# Patient Record
Sex: Female | Born: 1995 | Race: Black or African American | Hispanic: No | Marital: Single | Smoking: Never smoker
Health system: Southern US, Community
[De-identification: ages and names within clinical notes are randomized; demographics above are authoritative.]

## PROBLEM LIST (undated history)

## (undated) DIAGNOSIS — J45909 Unspecified asthma, uncomplicated: Secondary | ICD-10-CM

## (undated) HISTORY — PX: TONSILLECTOMY: SUR1361

## (undated) HISTORY — PX: WISDOM TOOTH EXTRACTION: SHX21

---

## 2017-07-04 ENCOUNTER — Encounter (HOSPITAL_COMMUNITY): Payer: Self-pay | Admitting: Emergency Medicine

## 2017-07-04 ENCOUNTER — Emergency Department (HOSPITAL_COMMUNITY)
Admission: EM | Admit: 2017-07-04 | Discharge: 2017-07-04 | Disposition: A | Discharge status: Home / Self Care | Payer: Medicaid - Out of State | Attending: Emergency Medicine | Admitting: Emergency Medicine

## 2017-07-04 ENCOUNTER — Emergency Department (HOSPITAL_COMMUNITY): Payer: Medicaid - Out of State

## 2017-07-04 DIAGNOSIS — J45909 Unspecified asthma, uncomplicated: Secondary | ICD-10-CM | POA: Diagnosis not present

## 2017-07-04 DIAGNOSIS — R05 Cough: Secondary | ICD-10-CM | POA: Diagnosis present

## 2017-07-04 DIAGNOSIS — Z3202 Encounter for pregnancy test, result negative: Secondary | ICD-10-CM | POA: Diagnosis not present

## 2017-07-04 DIAGNOSIS — B349 Viral infection, unspecified: Secondary | ICD-10-CM | POA: Diagnosis not present

## 2017-07-04 HISTORY — DX: Unspecified asthma, uncomplicated: J45.909

## 2017-07-04 LAB — RESPIRATORY PANEL BY PCR
ADENOVIRUS-RVPPCR: NOT DETECTED
BORDETELLA PERTUSSIS-RVPCR: NOT DETECTED
CHLAMYDOPHILA PNEUMONIAE-RVPPCR: NOT DETECTED
CORONAVIRUS NL63-RVPPCR: NOT DETECTED
Coronavirus 229E: NOT DETECTED
Coronavirus HKU1: NOT DETECTED
Coronavirus OC43: DETECTED — AB
INFLUENZA A H1-RVPPCR: NOT DETECTED
INFLUENZA A-RVPPCR: NOT DETECTED
INFLUENZA B-RVPPCR: NOT DETECTED
Influenza A H1 2009: NOT DETECTED
Influenza A H3: NOT DETECTED
Metapneumovirus: NOT DETECTED
Mycoplasma pneumoniae: NOT DETECTED
PARAINFLUENZA VIRUS 3-RVPPCR: NOT DETECTED
PARAINFLUENZA VIRUS 4-RVPPCR: NOT DETECTED
Parainfluenza Virus 1: NOT DETECTED
Parainfluenza Virus 2: NOT DETECTED
RESPIRATORY SYNCYTIAL VIRUS-RVPPCR: NOT DETECTED
RHINOVIRUS / ENTEROVIRUS - RVPPCR: NOT DETECTED

## 2017-07-04 LAB — POC URINE PREG, ED: Preg Test, Ur: NEGATIVE

## 2017-07-04 LAB — INFLUENZA PANEL BY PCR (TYPE A & B)
INFLBPCR: NEGATIVE
Influenza A By PCR: NEGATIVE

## 2017-07-04 MED ORDER — IBUPROFEN 800 MG PO TABS: 800.0000 mg | ORAL_TABLET | Freq: Three times a day (TID) | ORAL | 0 refills | Status: AC

## 2017-07-04 MED ORDER — FLUTICASONE PROPIONATE 50 MCG/ACT NA SUSP: 1.0000 | Freq: Every day | NASAL | 2 refills | Status: AC

## 2017-07-04 MED ORDER — BENZONATATE 100 MG PO CAPS: 100.0000 mg | ORAL_CAPSULE | Freq: Three times a day (TID) | ORAL | 0 refills | Status: AC | PRN

## 2017-07-04 NOTE — ED Triage Notes (Addendum)
Pt presents with cough, congestion, chills, body aches, ear aches, headache since thurs; pt also wants preg test, LMP in December 2018

## 2017-07-04 NOTE — ED Provider Notes (Signed)
[facility] Provider Note   CSN: [id number] Arrival date & time: 07/04/17  1454     History   Chief Complaint Chief Complaint  Patient presents with  . Influenza  . Possible Pregnancy    HPI Cassidy Luna is a 22 y.o. female with a hx of asthma who presents with complaint of progressively worsening influenza like sxs that started 5 days ago. Patient reports congestion, rhinorrhea, eye watering, sinus congestion/pressure, bilateral ear pressure/popping, mild sore throat, and cough that is productive with green mucous sputum. States mild chest discomfort and dyspnea with coughing- otherwise none. States she has also had subjective fever, chills, and generalized body aches. States she has tried Mucinex and OTC cold relief medicine each without significant improvement. No other specific alleviating/aggravating factors.   Patient also requesting pregnancy test, LMP 04/26/17- states she is taking birth control, however had some issues taking this as prescribed in December- missed a few pills. Denies vaginal bleeding/discharge. Denies abdominal/pelvic pain. No concern for any type of STD.    HPI  Past Medical History:  Diagnosis Date  . Asthma     There are no active problems to display for this patient.   Past Surgical History:  Procedure Laterality Date  . TONSILLECTOMY     adenoids  . WISDOM TOOTH EXTRACTION      OB History    No data available       Home Medications    Prior to Admission medications   Not on File    Family History History reviewed. No pertinent family history.  Social History Social History   Tobacco Use  . Smoking status: Never Smoker  . Smokeless tobacco: Never Used  Substance Use Topics  . Alcohol use: Yes    Frequency: Never    Comment: socially  . Drug use: Yes    Types: Marijuana     Allergies   Patient has no known allergies.   Review of Systems Review of Systems  Constitutional:  Positive for chills and fever (subjective).  HENT: Positive for congestion, ear pain, rhinorrhea, sinus pressure and sore throat.   Eyes: Positive for discharge (watery). Negative for photophobia, redness and visual disturbance.  Respiratory: Positive for cough and shortness of breath (with coughing only).   Cardiovascular: Positive for chest pain (with coughing only). Negative for palpitations and leg swelling.  Gastrointestinal: Negative for abdominal pain, nausea and vomiting.  Musculoskeletal: Positive for myalgias (generalized).  All other systems reviewed and are negative.  Physical Exam Updated Vital Signs BP (!) 126/59 (BP Location: Right Arm)   Pulse 73   Temp 98.1 F (36.7 C) (Oral)   Resp 16   Wt 110.2 kg (243 lb)   LMP 04/26/2017   SpO2 99%   Physical Exam  Constitutional: She appears well-developed and well-nourished. No distress.  HENT:  Head: Normocephalic and atraumatic.  Right Ear: Tympanic membrane is not perforated, not erythematous, not retracted and not bulging.  Left Ear: Tympanic membrane is not perforated, not erythematous, not retracted and not bulging.  Nose: Mucosal edema present. Right sinus exhibits no maxillary sinus tenderness and no frontal sinus tenderness. Left sinus exhibits no maxillary sinus tenderness and no frontal sinus tenderness.  Mouth/Throat: Uvula is midline and oropharynx is clear and moist. No oropharyngeal exudate or posterior oropharyngeal erythema.  Eyes: Conjunctivae are normal. Pupils are equal, round, and reactive to light. Right eye exhibits no discharge. Left eye exhibits no discharge.  Neck: Normal range of motion. Neck  supple.  Cardiovascular: Normal rate and regular rhythm.  No murmur heard. Pulmonary/Chest: Breath sounds normal. No respiratory distress. She has no wheezes. She has no rales.  Abdominal: Soft. She exhibits no distension. There is no tenderness.  Lymphadenopathy:    She has no cervical adenopathy.    Neurological: She is alert.  Skin: Skin is warm and dry. No rash noted.  Psychiatric: She has a normal mood and affect. Her behavior is normal.  Nursing note and vitals reviewed.   ED Treatments / Results  Labs Results for orders placed or performed during the hospital encounter of 07/04/17  Influenza panel by PCR (type A & B)  Result Value Ref Range   Influenza A By PCR NEGATIVE NEGATIVE   Influenza B By PCR NEGATIVE NEGATIVE  POC Urine Pregnancy, ED (do NOT order at [facility])  Result Value Ref Range   Preg Test, Ur NEGATIVE NEGATIVE   EKG  EKG Interpretation None      Radiology Dg Chest 2 View  Result Date: 07/04/2017 CLINICAL DATA:  Cough with fever EXAM: CHEST  2 VIEW COMPARISON:  None. FINDINGS: The heart size and mediastinal contours are within normal limits. Both lungs are clear. The visualized skeletal structures are unremarkable. IMPRESSION: No active cardiopulmonary disease. Electronically Signed   By: Tollie Eth M.D.   On: 07/04/2017 17:55   Procedures Procedures (including critical care time)  Medications Ordered in ED Medications - No data to display   Initial Impression / Assessment and Plan / ED Course  I have reviewed the triage vital signs and the nursing notes.  Pertinent labs & imaging results that were available during my care of the patient were reviewed by me and considered in my medical decision making (see chart for details).    Patient presents with symptoms consistent with viral illness. She is nontoxic appearing, in no apparent distress, no significant vital sign abnormalities. Patient is afebrile and without adventitious sounds on lung exam, CXR negative for infiltrate, doubt PNA. No wheezing on exam. Afebrile, no sinus tenderness, sxs have not been present for 1 week, doubt bacterial sinusitis. Centor score 0, doubt strep pharyngitis. Influenza test negative. Suspect viral etiology, will treat supportively with Ibuprofen, Flonase, and Tessalon.  Additionally patient requesting pregnancy test after oral contraceptive noncompliance- urine pregnancy test negative.  I discussed results, treatment plan, need for PCP follow-up, and return precautions with the patient. Provided opportunity for questions, patient confirmed understanding and is in agreement with plan.   Final Clinical Impressions(s) / ED Diagnoses   Final diagnoses:  Viral illness  Negative pregnancy test    ED Discharge Orders        Ordered    fluticasone (FLONASE) 50 MCG/ACT nasal spray  Daily     07/04/17 1821    benzonatate (TESSALON) 100 MG capsule  3 times daily PRN     07/04/17 1821    ibuprofen (ADVIL,MOTRIN) 800 MG tablet  3 times daily     07/04/17 1821       Petrucelli, Pleas Koch, PA-C 07/04/17 Paulo Fruit    Margarita Grizzle, MD 07/05/17 [id number]

## 2017-07-04 NOTE — ED Notes (Signed)
Pt given crackers and another gingerale

## 2017-07-04 NOTE — Discharge Instructions (Signed)
You were seen in the emergency today and diagnosed with a viral illness.  Your chest x-ray was normal, there were no signs of pneumonia.  Your urine pregnancy test was negative.  Your flu swab test was negative.  I have prescribed you multiple medications to treat your symptoms.   -Flonase to be used 1 spray in each nostril daily.  This medication is used to treat your congestion.  -Tessalon can be taken once every 8 hours as needed.  This medication is used to treat your cough.  -Ibuprofen to be taken once every 8 hours as needed for pain.  You will need to follow-up with your primary care provider in 1 week if your symptoms have not improved.  If you do not have a primary care provider one is provided in your discharge instructions.  Return to the emergency department for any new or worsening symptoms including but not limited to persistent fever not improved with motrin/tylenol, difficulty breathing, chest pain, or passing out.

## 2018-10-21 IMAGING — DX DG CHEST 2V
2 series · 2 of 2 positions shown · non-contrast
Comparison: None.

CLINICAL DATA: Cough with fever

EXAM:
CHEST  2 VIEW

[chest pa]
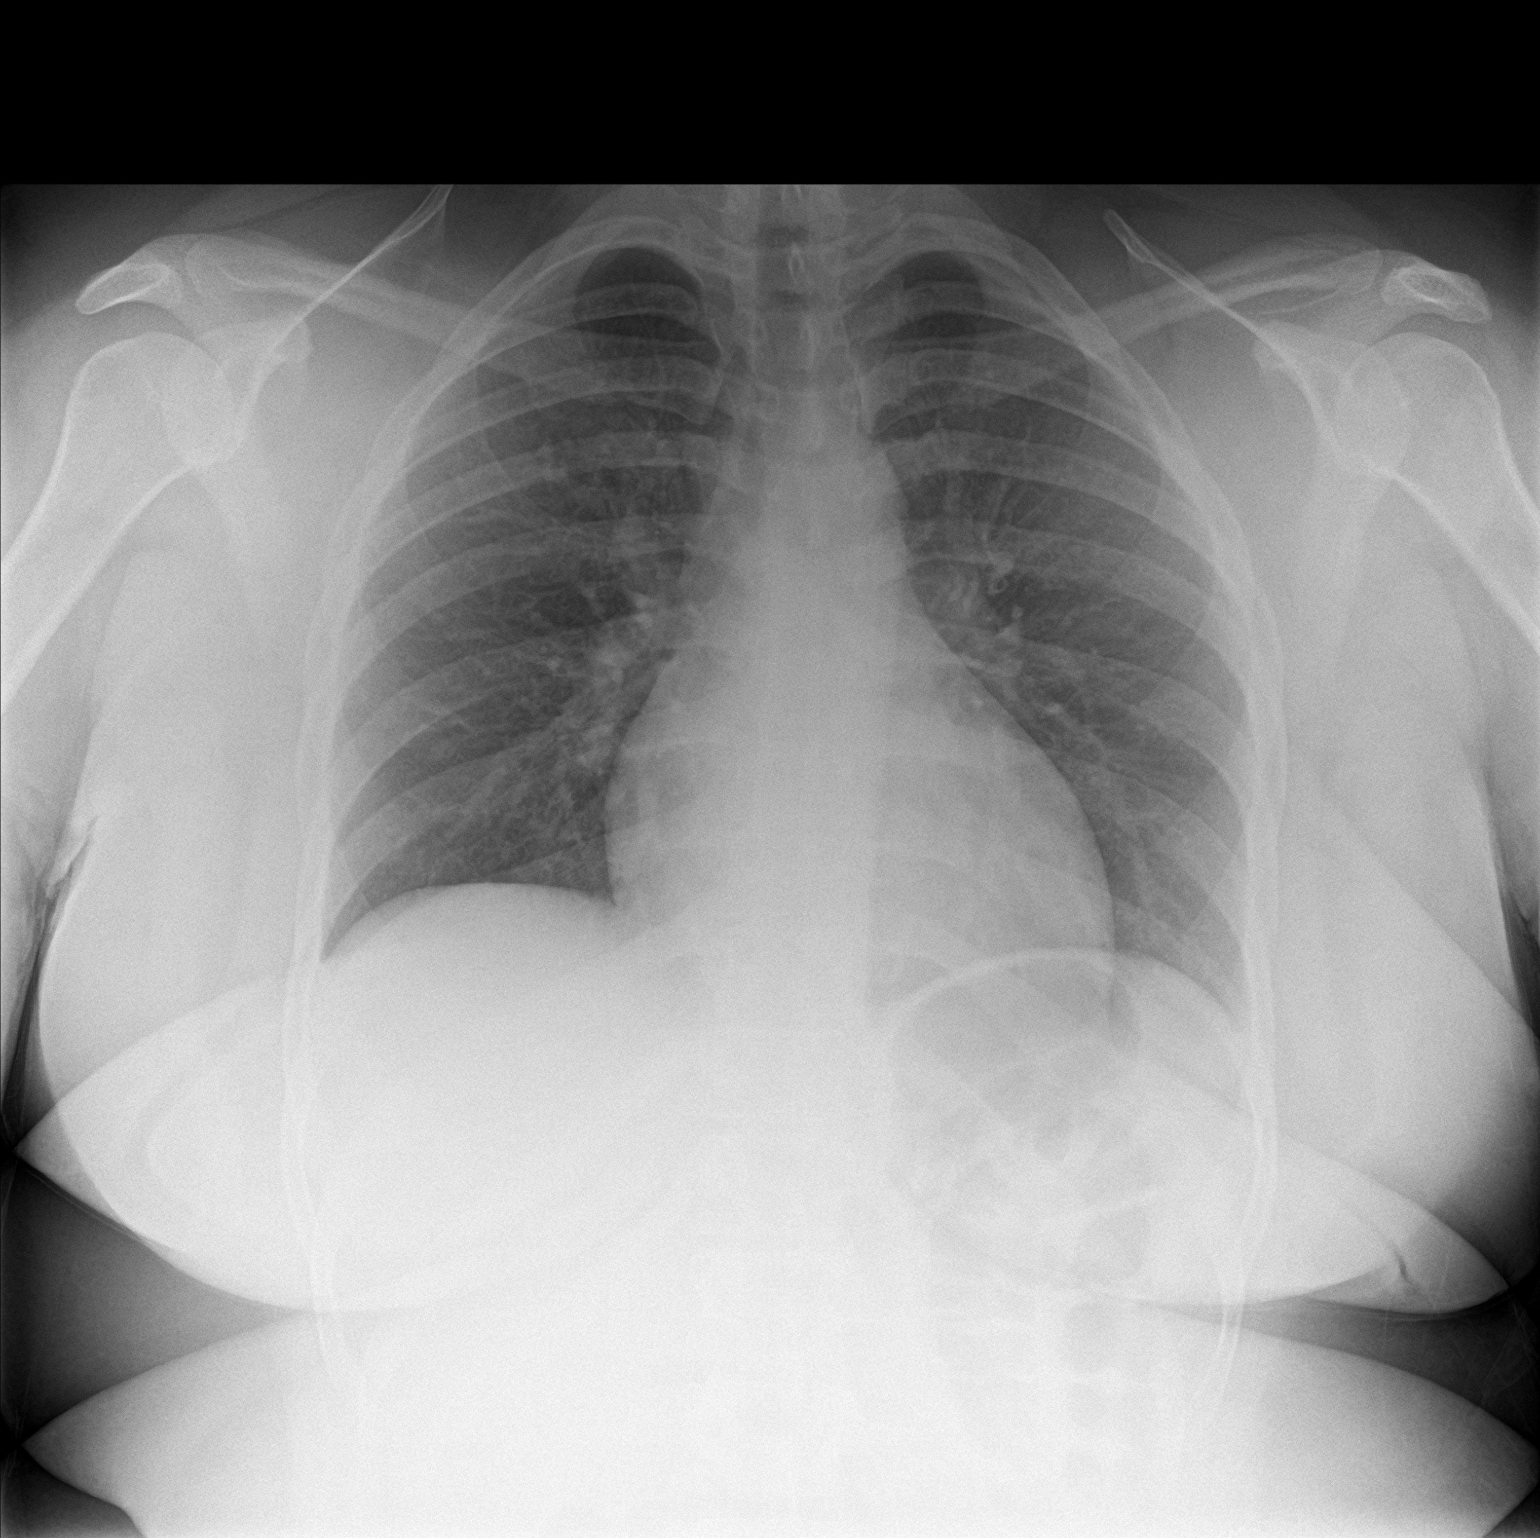

[chest lat]
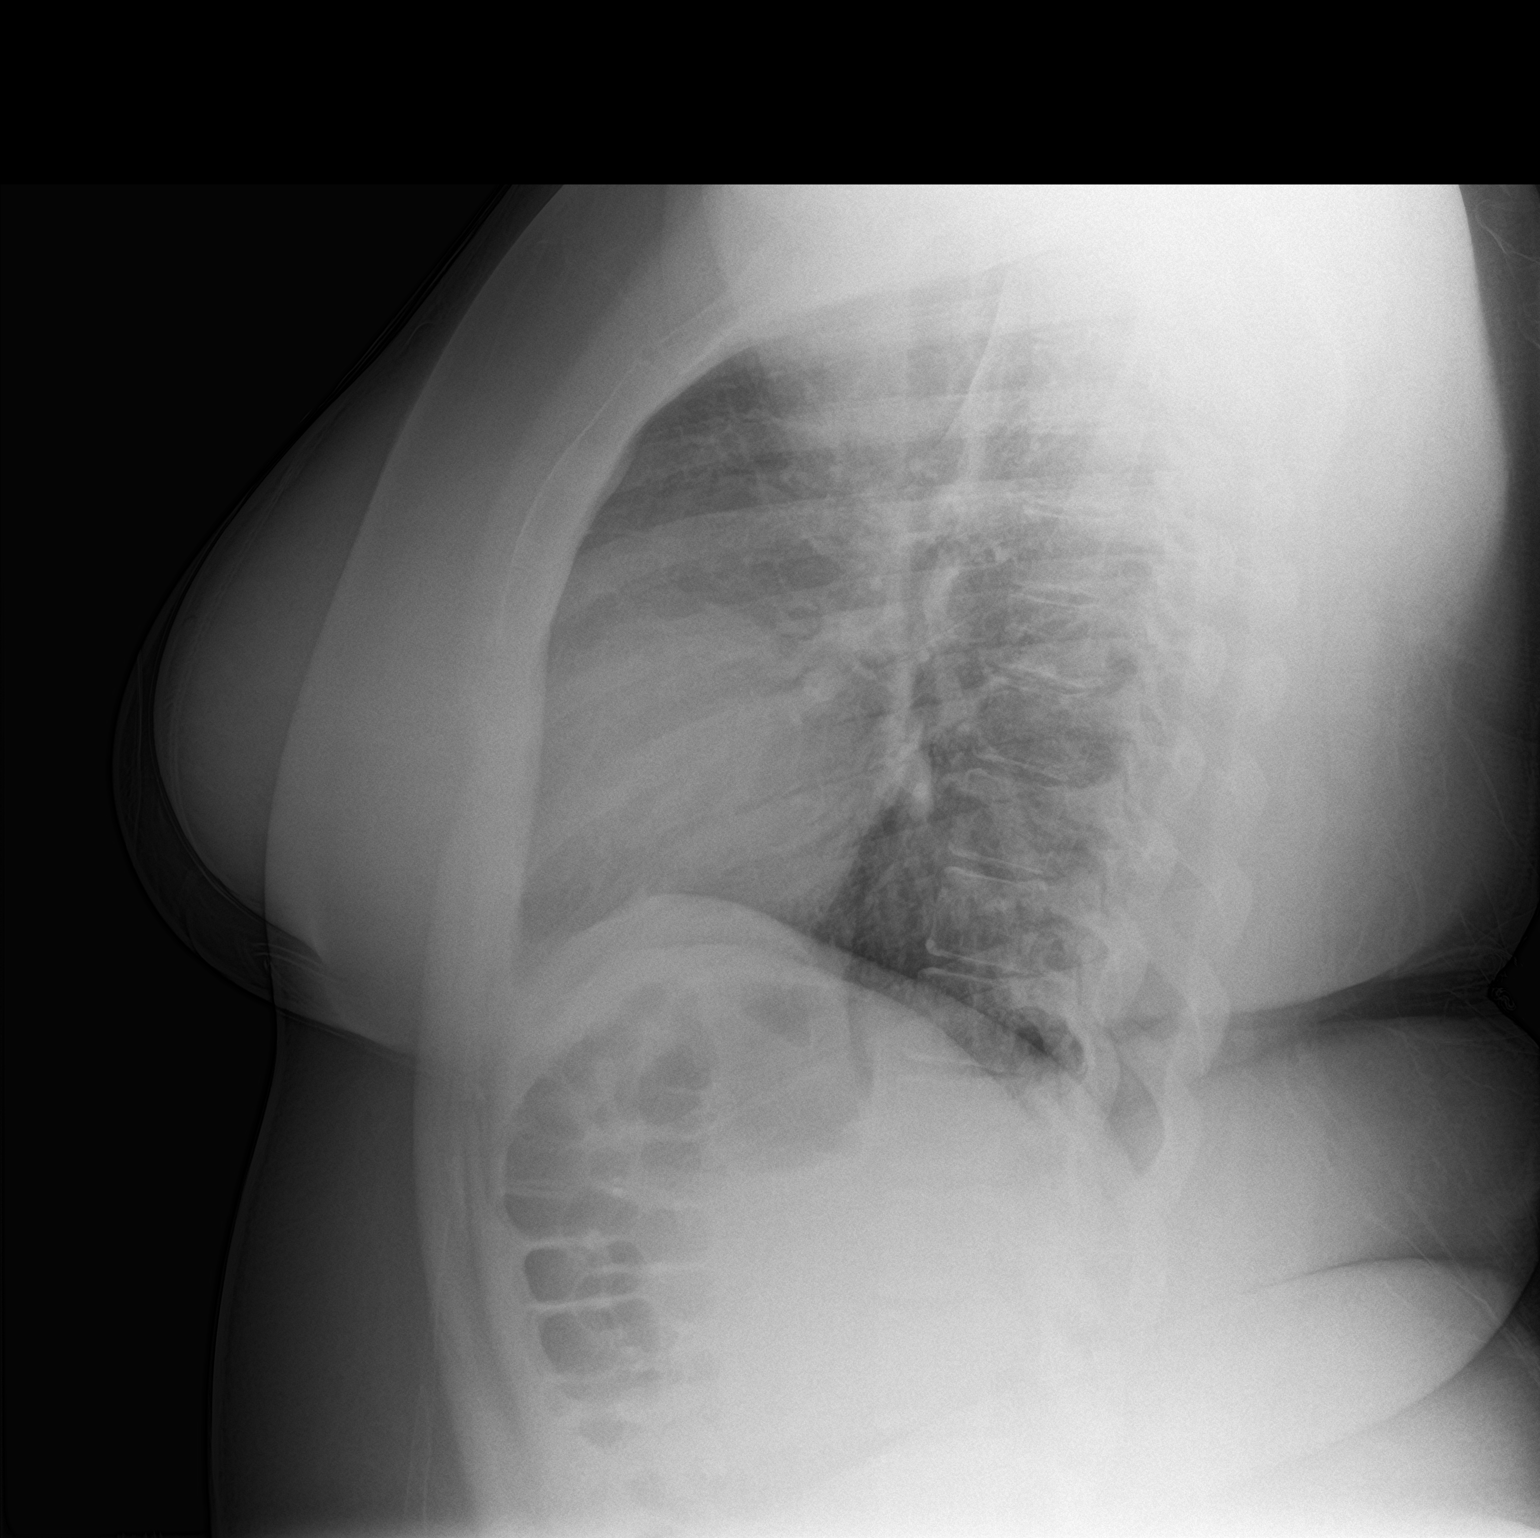

[2 of 2 positions shown; findings below may reference images not displayed]

FINDINGS: The heart size and mediastinal contours are within normal limits.
Both lungs are clear. The visualized skeletal structures are
unremarkable.
IMPRESSION: No active cardiopulmonary disease.
